# Patient Record
Sex: Male | Born: 1990 | Race: White | Hispanic: No | Marital: Single | Smoking: Current every day smoker
Health system: Southern US, Community
[De-identification: ages and names within clinical notes are randomized; demographics above are authoritative.]

## PROBLEM LIST (undated history)

## (undated) HISTORY — PX: KNEE SURGERY: SHX244

## (undated) HISTORY — PX: ANKLE SURGERY: SHX546

---
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
## 2014-08-28 ENCOUNTER — Encounter (HOSPITAL_COMMUNITY): Payer: Self-pay | Admitting: *Deleted

## 2014-08-28 ENCOUNTER — Emergency Department (HOSPITAL_COMMUNITY)
Admission: EM | Admit: 2014-08-28 | Discharge: 2014-08-28 | Disposition: A | Discharge status: Home / Self Care | Attending: Emergency Medicine | Admitting: Emergency Medicine

## 2014-08-28 DIAGNOSIS — R300 Dysuria: Secondary | ICD-10-CM | POA: Diagnosis present

## 2014-08-28 DIAGNOSIS — Z79899 Other long term (current) drug therapy: Secondary | ICD-10-CM | POA: Diagnosis not present

## 2014-08-28 DIAGNOSIS — Z720 Tobacco use: Secondary | ICD-10-CM | POA: Insufficient documentation

## 2014-08-28 DIAGNOSIS — Z880 Allergy status to penicillin: Secondary | ICD-10-CM | POA: Diagnosis not present

## 2014-08-28 LAB — URINALYSIS, ROUTINE W REFLEX MICROSCOPIC
Bilirubin Urine: NEGATIVE
Glucose, UA: NEGATIVE mg/dL
HGB URINE DIPSTICK: NEGATIVE
Ketones, ur: NEGATIVE mg/dL
LEUKOCYTES UA: NEGATIVE
Nitrite: NEGATIVE
PH: 6.5 (ref 5.0–8.0)
PROTEIN: NEGATIVE mg/dL
SPECIFIC GRAVITY, URINE: 1.01 (ref 1.005–1.030)
UROBILINOGEN UA: 0.2 mg/dL (ref 0.0–1.0)

## 2014-08-28 MED ORDER — CIPROFLOXACIN HCL 250 MG PO TABS
500.0000 mg | ORAL_TABLET | Freq: Once | ORAL | Status: AC
  Administered 2014-08-28: 500 mg via ORAL
  Filled 2014-08-28: qty 2

## 2014-08-28 MED ORDER — CIPROFLOXACIN HCL 500 MG PO TABS: 500.0000 mg | ORAL_TABLET | Freq: Two times a day (BID) | ORAL | Status: AC

## 2014-08-28 NOTE — Discharge Instructions (Signed)

## 2014-08-28 NOTE — ED Provider Notes (Signed)
CSN: [id number]     Arrival date & time 08/28/14  1327 History  This chart was scribed for non-physician practitioner, Burgess AmorJulie Ayman Brull, PA-C,working with Geoffery Lyonsouglas Delo, MD, by Karle PlumberJennifer Tensley, ED Scribe. This patient was seen in room APFT20/APFT20 and the patient's care was started at 3:50 PM.  Chief Complaint  Patient presents with  . Dysuria   Patient is a 23 y.o. male presenting with dysuria. The history is provided by the patient. No language interpreter was used.  Dysuria Pertinent negatives include no chest pain, no abdominal pain, no headaches and no shortness of breath.    HPI Comments:  Devin Wood is a 23 y.o. male who presents to the Emergency Department complaining of penile drainage and itching, urinary frequency and dysuria that started about one month ago. He states [place] and was treated for chlamydia. He state he had blood drawn and his urine was checked. He states he recently received the results for the tests and they were negative. He states his partner was also checked and the results were also negative. He reports that the symptoms went away after taking the antibiotics but have since returned since he has been home this week. Pt also reports some lower back soreness upon pressing. He believes he experienced some hematuria yesterday morning. He states he saw a small amount of white purulent drainage today. He states he has been drinking cranberry juice, water and taking cranberry tablets. He denies nausea, vomiting, abdominal pain, rash, fever or chills.   History reviewed. No pertinent past medical history. Past Surgical History  Procedure Laterality Date  . Knee surgery    . Ankle surgery     No family history on file. History  Substance Use Topics  . Smoking status: Current Every Day Smoker    Types: Cigarettes  . Smokeless tobacco: Not on file  . Alcohol Use: Yes    Review of Systems  Constitutional: Negative for  fever and chills.  HENT: Negative for congestion and sore throat.   Eyes: Negative.   Respiratory: Negative for chest tightness and shortness of breath.   Cardiovascular: Negative for chest pain.  Gastrointestinal: Negative for nausea, vomiting and abdominal pain.  Genitourinary: Positive for dysuria, frequency and discharge. Negative for genital sores.  Musculoskeletal: Negative for joint swelling, arthralgias and neck pain.  Skin: Negative.  Negative for rash and wound.  Neurological: Negative for dizziness, weakness, light-headedness, numbness and headaches.  Psychiatric/Behavioral: Negative.     Allergies  Amoxicillin and Sulfonamide derivatives  Home Medications   Prior to Admission medications   Medication Sig Start Date End Date Taking? Authorizing Provider  CRANBERRY PO Take 1 capsule by mouth daily as needed (for relief).   Yes Historical Provider, MD  ciprofloxacin (CIPRO) 500 MG tablet Take 1 tablet (500 mg total) by mouth 2 (two) times daily. 08/28/14   Burgess AmorJulie Moua Rasmusson, PA-C   Triage Vitals: BP 141/71 mmHg  Pulse 56  Temp(Src) 98.2 F (36.8 C) (Oral)  Resp 16  Ht 5\' 9"  (1.753 m)  Wt 190 lb (86.183 kg)  BMI 28.05 kg/m2  SpO2 100% Physical Exam  Constitutional: He appears well-developed and well-nourished.  HENT:  Head: Normocephalic and atraumatic.  Eyes: Conjunctivae are normal.  Neck: Normal range of motion.  Cardiovascular: Normal rate, regular rhythm, normal heart sounds and intact distal pulses.   Pulmonary/Chest: Effort normal and breath sounds normal. He has no wheezes.  Abdominal: Soft. Bowel sounds are  normal. There is no tenderness.  Musculoskeletal: Normal range of motion.  Neurological: He is alert.  Skin: Skin is warm and dry.  Psychiatric: He has a normal mood and affect.  Nursing note and vitals reviewed.   ED Course  Procedures (including critical care time) DIAGNOSTIC STUDIES: Oxygen Saturation is 100% on RA, normal by my interpretation.    COORDINATION OF CARE: 4:07 PM- Will check for GC/chlamydia and will culture urine. Will treat for UTI. Pt verbalizes understanding and agrees to plan.  Medications  ciprofloxacin (CIPRO) tablet 500 mg (500 mg Oral Given 08/28/14 1644)    Labs Review Labs Reviewed  URINALYSIS, ROUTINE W REFLEX MICROSCOPIC - Abnormal; Notable for the following:    Color, Urine STRAW (*)    All other components within normal limits  GC/CHLAMYDIA PROBE AMP  URINE CULTURE    Imaging Review No results found.   EKG Interpretation None      MDM   Final diagnoses:  Dysuria    Pt with dysuria along with penile dc.  He was tx for gc/chlamydia 2 weeks ago for similar sx. Gc/chlamydia cx collected, urine cx also sent.  Will tx dysuria with cipro.  Discussed prophylactic tx for gc/chlamydia/  Pt hesitant since he and partner have already tested negative. Will await cx results. Pt understand we will contact if positive. Encouraged to complete cipro course. Also bought azo - encouraged to take this for discomfort.  The patient appears reasonably screened and/or stabilized for discharge and I doubt any other medical condition or other [facility] requiring further screening, evaluation, or treatment in the ED at this time prior to discharge.   I personally performed the services described in this documentation, which was scribed in my presence. The recorded information has been reviewed and is accurate.    Burgess AmorJulie Tremel Setters, PA-C 08/30/14 2315  Geoffery Lyonsouglas Delo, MD 09/01/14 2111

## 2014-08-28 NOTE — ED Notes (Addendum)
Pt states 2 weeks ago he was having irritation to the urethra. States drainage from penis is sometimes cloudy and sometimes looks like pus. [place] that he was treated for chlamydia but the results of STI's came back negative. States he was not tested for UTI. Tenderness to lower back , per pt and dysuria.

## 2014-08-29 LAB — URINE CULTURE
Colony Count: NO GROWTH
Culture: NO GROWTH

## 2014-08-29 LAB — GC/CHLAMYDIA PROBE AMP
CT Probe RNA: NEGATIVE
GC Probe RNA: NEGATIVE
